# Patient Record
Sex: Male | Born: 1995
Health system: Southern US, Community
[De-identification: ages and names within clinical notes are randomized; demographics above are authoritative.]

---
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
## 2015-02-17 ENCOUNTER — Emergency Department: Payer: No Typology Code available for payment source

## 2015-02-17 ENCOUNTER — Encounter: Payer: Self-pay | Admitting: Emergency Medicine

## 2015-02-17 ENCOUNTER — Emergency Department
Admission: EM | Admit: 2015-02-17 | Discharge: 2015-02-17 | Disposition: A | Discharge status: Home / Self Care | Payer: No Typology Code available for payment source | Attending: Emergency Medicine | Admitting: Emergency Medicine

## 2015-02-17 DIAGNOSIS — Z23 Encounter for immunization: Secondary | ICD-10-CM | POA: Diagnosis not present

## 2015-02-17 DIAGNOSIS — Y9389 Activity, other specified: Secondary | ICD-10-CM | POA: Insufficient documentation

## 2015-02-17 DIAGNOSIS — S0990XA Unspecified injury of head, initial encounter: Secondary | ICD-10-CM | POA: Diagnosis present

## 2015-02-17 DIAGNOSIS — S0001XA Abrasion of scalp, initial encounter: Secondary | ICD-10-CM | POA: Insufficient documentation

## 2015-02-17 DIAGNOSIS — Y998 Other external cause status: Secondary | ICD-10-CM | POA: Diagnosis not present

## 2015-02-17 DIAGNOSIS — Y9289 Other specified places as the place of occurrence of the external cause: Secondary | ICD-10-CM | POA: Insufficient documentation

## 2015-02-17 DIAGNOSIS — S0219XA Other fracture of base of skull, initial encounter for closed fracture: Secondary | ICD-10-CM

## 2015-02-17 DIAGNOSIS — S0011XA Contusion of right eyelid and periocular area, initial encounter: Secondary | ICD-10-CM | POA: Diagnosis not present

## 2015-02-17 DIAGNOSIS — S060X1A Concussion with loss of consciousness of 30 minutes or less, initial encounter: Secondary | ICD-10-CM | POA: Diagnosis not present

## 2015-02-17 MED ORDER — TETANUS-DIPHTH-ACELL PERTUSSIS 5-2.5-18.5 LF-MCG/0.5 IM SUSP
0.5000 mL | Freq: Once | INTRAMUSCULAR | Status: AC
  Administered 2015-02-17: 0.5 mL via INTRAMUSCULAR
  Filled 2015-02-17: qty 0.5

## 2015-02-17 MED ORDER — IBUPROFEN 800 MG PO TABS: 800.0000 mg | ORAL_TABLET | Freq: Three times a day (TID) | ORAL | Status: DC | PRN
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
## 2015-02-17 NOTE — Discharge Instructions (Signed)
Concussion, Adult A concussion, or closed-head injury, is a brain injury caused by a direct blow to the head or by a quick and sudden movement (jolt) of the head or neck. Concussions are usually not life-threatening. Even so, the effects of a concussion can be serious. If you have had a concussion before, you are more likely to experience concussion-like symptoms after a direct blow to the head.  CAUSES  Direct blow to the head, such as from running into another player during a soccer game, being hit in a fight, or hitting your head on a hard surface.  A jolt of the head or neck that causes the brain to move back and forth inside the skull, such as in a car crash. SIGNS AND SYMPTOMS The signs of a concussion can be hard to notice. Early on, they may be missed by you, family members, and health care providers. You may look fine but act or feel differently. Symptoms are usually temporary, but they may last for days, weeks, or even longer. Some symptoms may appear right away while others may not show up for hours or days. Every head injury is different. Symptoms include:  Mild to moderate headaches that will not go away.  A feeling of pressure inside your head.  Having more trouble than usual:  Learning or remembering things you have heard.  Answering questions.  Paying attention or concentrating.  Organizing daily tasks.  Making decisions and solving problems.  Slowness in thinking, acting or reacting, speaking, or reading.  Getting lost or being easily confused.  Feeling tired all the time or lacking energy (fatigued).  Feeling drowsy.  Sleep disturbances.  Sleeping more than usual.  Sleeping less than usual.  Trouble falling asleep.  Trouble sleeping (insomnia).  Loss of balance or feeling lightheaded or dizzy.  Nausea or vomiting.  Numbness or tingling.  Increased sensitivity to:  Sounds.  Lights.  Distractions.  Vision problems or eyes that tire  easily.  Diminished sense of taste or smell.  Ringing in the ears.  Mood changes such as feeling sad or anxious.  Becoming easily irritated or angry for little or no reason.  Lack of motivation.  Seeing or hearing things other people do not see or hear (hallucinations). DIAGNOSIS Your health care provider can usually diagnose a concussion based on a description of your injury and symptoms. He or she will ask whether you passed out (lost consciousness) and whether you are having trouble remembering events that happened right before and during your injury. Your evaluation might include:  A brain scan to look for signs of injury to the brain. Even if the test shows no injury, you may still have a concussion.  Blood tests to be sure other problems are not present. TREATMENT  Concussions are usually treated in an emergency department, in urgent care, or at a clinic. You may need to stay in the hospital overnight for further treatment.  Tell your health care provider if you are taking any medicines, including prescription medicines, over-the-counter medicines, and natural remedies. Some medicines, such as blood thinners (anticoagulants) and aspirin, may increase the chance of complications. Also tell your health care provider whether you have had alcohol or are taking illegal drugs. This information may affect treatment.  Your health care provider will send you home with important instructions to follow.  How fast you will recover from a concussion depends on many factors. These factors include how severe your concussion is, what part of your brain was injured,  your age, and how healthy you were before the concussion.  Most people with mild injuries recover fully. Recovery can take time. In general, recovery is slower in older persons. Also, persons who have had a concussion in the past or have other medical problems may find that it takes longer to recover from their current injury. HOME  CARE INSTRUCTIONS General Instructions  Carefully follow the directions your health care provider gave you.  Only take over-the-counter or prescription medicines for pain, discomfort, or fever as directed by your health care provider.  Take only those medicines that your health care provider has approved.  Do not drink alcohol until your health care provider says you are well enough to do so. Alcohol and certain other drugs may slow your recovery and can put you at risk of further injury.  If it is harder than usual to remember things, write them down.  If you are easily distracted, try to do one thing at a time. For example, do not try to watch TV while fixing dinner.  Talk with family members or close friends when making important decisions.  Keep all follow-up appointments. Repeated evaluation of your symptoms is recommended for your recovery.  Watch your symptoms and tell others to do the same. Complications sometimes occur after a concussion. Older adults with a brain injury may have a higher risk of serious complications, such as a blood clot on the brain.  Tell your teachers, school nurse, school counselor, coach, athletic trainer, or work Freight forwarder about your injury, symptoms, and restrictions. Tell them about what you can or cannot do. They should watch for:  Increased problems with attention or concentration.  Increased difficulty remembering or learning new information.  Increased time needed to complete tasks or assignments.  Increased irritability or decreased ability to cope with stress.  Increased symptoms.  Rest. Rest helps the brain to heal. Make sure you:  Get plenty of sleep at night. Avoid staying up late at night.  Keep the same bedtime hours on weekends and weekdays.  Rest during the day. Take daytime naps or rest breaks when you feel tired.  Limit activities that require a lot of thought or concentration. These include:  Doing homework or job-related  work.  Watching TV.  Working on the computer.  Avoid any situation where there is potential for another head injury (football, hockey, soccer, basketball, martial arts, downhill snow sports and horseback riding). Your condition will get worse every time you experience a concussion. You should avoid these activities until you are evaluated by the appropriate follow-up health care providers. Returning To Your Regular Activities You will need to return to your normal activities slowly, not all at once. You must give your body and brain enough time for recovery.  Do not return to sports or other athletic activities until your health care provider tells you it is safe to do so.  Ask your health care provider when you can drive, ride a bicycle, or operate heavy machinery. Your ability to react may be slower after a brain injury. Never do these activities if you are dizzy.  Ask your health care provider about when you can return to work or school. Preventing Another Concussion It is very important to avoid another brain injury, especially before you have recovered. In rare cases, another injury can lead to permanent brain damage, brain swelling, or death. The risk of this is greatest during the first 7-10 days after a head injury. Avoid injuries by:  Wearing a  seat belt when riding in a car.  Drinking alcohol only in moderation.  Wearing a helmet when biking, skiing, skateboarding, skating, or doing similar activities.  Avoiding activities that could lead to a second concussion, such as contact or recreational sports, until your health care provider says it is okay.  Taking safety measures in your home.  Remove clutter and tripping hazards from floors and stairways.  Use grab bars in bathrooms and handrails by stairs.  Place non-slip mats on floors and in bathtubs.  Improve lighting in dim areas. SEEK MEDICAL CARE IF:  You have increased problems paying attention or  concentrating.  You have increased difficulty remembering or learning new information.  You need more time to complete tasks or assignments than before.  You have increased irritability or decreased ability to cope with stress.  You have more symptoms than before. Seek medical care if you have any of the following symptoms for more than 2 weeks after your injury:  Lasting (chronic) headaches.  Dizziness or balance problems.  Nausea.  Vision problems.  Increased sensitivity to noise or light.  Depression or mood swings.  Anxiety or irritability.  Memory problems.  Difficulty concentrating or paying attention.  Sleep problems.  Feeling tired all the time. SEEK IMMEDIATE MEDICAL CARE IF:  You have severe or worsening headaches. These may be a sign of a blood clot in the brain.  You have weakness (even if only in one hand, leg, or part of the face).  You have numbness.  You have decreased coordination.  You vomit repeatedly.  You have increased sleepiness.  One pupil is larger than the other.  You have convulsions.  You have slurred speech.  You have increased confusion. This may be a sign of a blood clot in the brain.  You have increased restlessness, agitation, or irritability.  You are unable to recognize people or places.  You have neck pain.  It is difficult to wake you up.  You have unusual behavior changes.  You lose consciousness. MAKE SURE YOU:  Understand these instructions.  Will watch your condition.  Will get help right away if you are not doing well or get worse.   This information is not intended to replace advice given to you by your health care provider. Make sure you discuss any questions you have with your health care provider.   Document Released: 06/15/2003 Document Revised: 04/15/2014 Document Reviewed: 10/15/2012 Elsevier Interactive Patient Education 2016 Else[employer]  Skull Fracture, Uncomplicated, Adult You have a  skull fracture. This happens when one of the bones of your head cracks or breaks. Your injury does not appear serious at this time and we feel you can be observed safely at home. An injury to the head that causes a skull fracture may also cause a concussion. With a concussion you may be knocked out for a brief moment (loss of consciousness). A concussion is the mildest form of traumatic brain injury. The symptoms of a concussion are short-lived and resolve on their own. The most common symptoms are confusion and forgetting what happened (amnesia). SYMPTOMS These minor symptoms may be experienced after discharge:  Memory difficulties.  Dizziness.  Headaches.  Hearing difficulties.  Vertigo or trouble with balance.  Depression.  Tiredness.  Difficulty with concentration.  Nausea.  Vomiting. A bruise on the brain (concussion) requires a few days for recovery the same as a bruise elsewhere on your body. It is common for people with injuries such as yours to experience such  problems. Usually these problems disappear without medical care. If symptoms remain for more than a few days, notify your caregiver. See your caregiver sooner if symptoms are becoming worse rather than better. HOME CARE INSTRUCTIONS   During the next 24 hours you should stay with an adult who can watch you for the above warning signs.  This person should wake you up every 02-03 hours to check on your condition, noting any of the above signs or symptoms. Problems which are getting worse mean you should call or return immediately to the facility where you were just seen, or to the nearest emergency department. In case of emergency or unconsciousness, call for local emergency medical help.  You should take clear liquids for the rest of the day and then resume your regular diet.  You should not take sedatives or alcoholic beverages for 48 hours after discharge.  After injuries such as yours, most serious problems happen  within the first 24 hours.  If x-rays or other testing were done, make sure you know how you are to get those results. It is your responsibility to call back for results when your caregiver suggests. Do not assume everything is fine if your caregiver has not been able to reach you.  Skull fractures usually do not need follow up x-rays. These fractures are not like broken arms. The position of the skull stays the same as when it was broken and usually heals without problems.  If you have a concussion be aware that symptoms may last up to a week or longer.  It is very important to keep any follow-up appointments after a head injury.  It is unlikely that serious side effects will occur. If they do occur it is usually soon after the accident but may occur as long as a week after the accident or injury. SEEK IMMEDIATE MEDICAL CARE IF:   Confusion or drowsiness develops; children frequently become drowsy after any type of trauma or injury.  A person cannot arouse the injured person.  You feel sick to your stomach (nausea) or persistent, forceful vomiting (projectile in nature).  Rapid back and forth movement of the eyes. This is called nystagmus.  Convulsions, seizures, or unconsciousness.  Severe persistent headaches not relieved by medication. Only take over-the-counter or prescription medicines for pain, discomfort, or fever as directed by your caregiver. (Do not take aspirin as this weakens blood clotting abilities).  Inability to use arms or legs appropriately.  Changes in pupil sizes.  Clear or bloody discharge from nose or ears.   This information is not intended to replace advice given to you by your health care provider. Make sure you discuss any questions you have with your health care provider.   Document Released: 01/24/2004 Document Revised: 06/17/2011 Document Reviewed: 09/16/2014 Elsevier Interactive Patient Education [employer] Elsevier Inc[employer]
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
## 2016-03-25 ENCOUNTER — Other Ambulatory Visit: Payer: Self-pay | Admitting: Family Medicine

## 2016-03-25 DIAGNOSIS — R319 Hematuria, unspecified: Secondary | ICD-10-CM

## 2016-03-25 DIAGNOSIS — R109 Unspecified abdominal pain: Secondary | ICD-10-CM

## 2016-03-26 ENCOUNTER — Ambulatory Visit
Admission: RE | Admit: 2016-03-26 | Discharge: 2016-03-26 | Disposition: A | Discharge status: Home / Self Care | Source: Ambulatory Visit | Attending: Family Medicine | Admitting: Family Medicine

## 2016-03-26 DIAGNOSIS — R109 Unspecified abdominal pain: Secondary | ICD-10-CM | POA: Diagnosis not present

## 2016-03-26 DIAGNOSIS — R319 Hematuria, unspecified: Secondary | ICD-10-CM | POA: Insufficient documentation

## 2017-10-29 DIAGNOSIS — G43109 Migraine with aura, not intractable, without status migrainosus: Secondary | ICD-10-CM | POA: Diagnosis not present

## 2018-02-05 DIAGNOSIS — J029 Acute pharyngitis, unspecified: Secondary | ICD-10-CM | POA: Diagnosis not present

## 2018-07-06 DIAGNOSIS — J069 Acute upper respiratory infection, unspecified: Secondary | ICD-10-CM | POA: Diagnosis not present

## 2019-01-12 ENCOUNTER — Encounter: Payer: Self-pay | Admitting: Intensive Care

## 2019-01-12 ENCOUNTER — Other Ambulatory Visit: Payer: Self-pay

## 2019-01-12 ENCOUNTER — Emergency Department: Payer: 59

## 2019-01-12 ENCOUNTER — Emergency Department
Admission: EM | Admit: 2019-01-12 | Discharge: 2019-01-12 | Disposition: A | Discharge status: Home / Self Care | Payer: 59 | Attending: Emergency Medicine | Admitting: Emergency Medicine

## 2019-01-12 DIAGNOSIS — Y999 Unspecified external cause status: Secondary | ICD-10-CM | POA: Diagnosis not present

## 2019-01-12 DIAGNOSIS — Y9389 Activity, other specified: Secondary | ICD-10-CM | POA: Diagnosis not present

## 2019-01-12 DIAGNOSIS — S39011A Strain of muscle, fascia and tendon of abdomen, initial encounter: Secondary | ICD-10-CM | POA: Diagnosis not present

## 2019-01-12 DIAGNOSIS — R1031 Right lower quadrant pain: Secondary | ICD-10-CM

## 2019-01-12 DIAGNOSIS — Y929 Unspecified place or not applicable: Secondary | ICD-10-CM | POA: Insufficient documentation

## 2019-01-12 DIAGNOSIS — F17290 Nicotine dependence, other tobacco product, uncomplicated: Secondary | ICD-10-CM | POA: Insufficient documentation

## 2019-01-12 DIAGNOSIS — X500XXA Overexertion from strenuous movement or load, initial encounter: Secondary | ICD-10-CM | POA: Insufficient documentation

## 2019-01-12 DIAGNOSIS — K4090 Unilateral inguinal hernia, without obstruction or gangrene, not specified as recurrent: Secondary | ICD-10-CM

## 2019-01-12 DIAGNOSIS — R1032 Left lower quadrant pain: Secondary | ICD-10-CM

## 2019-01-12 DIAGNOSIS — S3991XA Unspecified injury of abdomen, initial encounter: Secondary | ICD-10-CM | POA: Diagnosis present

## 2019-01-12 LAB — URINALYSIS, COMPLETE (UACMP) WITH MICROSCOPIC
Bacteria, UA: NONE SEEN
Bilirubin Urine: NEGATIVE
Glucose, UA: NEGATIVE mg/dL
Hgb urine dipstick: NEGATIVE
Ketones, ur: NEGATIVE mg/dL
Leukocytes,Ua: NEGATIVE
Nitrite: NEGATIVE
Protein, ur: NEGATIVE mg/dL
Specific Gravity, Urine: 1.021 (ref 1.005–1.030)
Squamous Epithelial / LPF: NONE SEEN (ref 0–5)
pH: 5 (ref 5.0–8.0)

## 2019-01-12 MED ORDER — TRAMADOL HCL 50 MG PO TABS
50.0000 mg | ORAL_TABLET | Freq: Once | ORAL | Status: AC
  Administered 2019-01-12: 50 mg via ORAL
  Filled 2019-01-12: qty 1

## 2019-01-12 MED ORDER — IBUPROFEN 600 MG PO TABS
600.0000 mg | ORAL_TABLET | Freq: Once | ORAL | Status: AC
  Administered 2019-01-12: 600 mg via ORAL
  Filled 2019-01-12: qty 1

## 2019-01-12 MED ORDER — IBUPROFEN 600 MG PO TABS: 600.0000 mg | ORAL_TABLET | Freq: Three times a day (TID) | ORAL | 0 refills | Status: AC | PRN

## 2019-01-12 MED ORDER — TRAMADOL HCL 50 MG PO TABS: 50.0000 mg | ORAL_TABLET | Freq: Four times a day (QID) | ORAL | 0 refills | Status: AC | PRN

## 2019-01-12 NOTE — ED Triage Notes (Signed)
Patient c/o bilateral groin pain. Also reports a bump is now present on penis. Reports sensitive to touch around private area

## 2019-01-12 NOTE — ED Notes (Signed)
Patient provided urine specimen cup, unable to provide specimen at this time.

## 2019-01-12 NOTE — ED Provider Notes (Signed)
[facility] Emergency Department Provider Note   ____________________________________________   First MD Initiated Contact with Patient 01/12/19 1204     (approximate)  I have reviewed the triage vital signs and the nursing notes.   HISTORY  Chief Complaint Groin Pain    HPI Ryan Poole is a 23 y.o. male patient complain of bilateral groin pain 2 days.  Patient the pain increased with squatting.  Patient state sensitive around superior aspect of the scrotum bilaterally.  Patient denies dysuria or urethral discharge.  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  Patient rates pain as a 5/10.  Patient described pain as "achy".  No palliative measure for complaint.         History reviewed. No pertinent past medical history.  There are no active problems to display for this patient.   History reviewed. No pertinent surgical history.  Prior to Admission medications   Medication Sig Start Date End Date Taking? Authorizing Provider  ibuprofen (ADVIL) 600 MG tablet Take 1 tablet (600 mg total) by mouth every 8 (eight) hours as needed. 01/12/19   Sable Feil, PA-C  traMADol (ULTRAM) 50 MG tablet Take 1 tablet (50 mg total) by mouth every 6 (six) hours as needed for up to 3 days. 01/12/19 01/15/19  Sable Feil, PA-C    Allergies Patient has no known allergies.  History reviewed. No pertinent family history.  Social History Social History   Tobacco Use  . Smoking status: Current Every Day Smoker    Types: E-cigarettes  . Smokeless tobacco: Never Used  Substance Use Topics  . Alcohol use: Yes    Alcohol/week: 8.0 standard drinks    Types: 8 Cans of beer per week  . Drug use: No    Review of Systems Constitutional: No fever/chills Eyes: No visual changes. ENT: No sore throat. Cardiovascular: Denies chest pain. Respiratory: Denies shortness of breath. Gastrointestinal: No abdominal  pain.  No nausea, no vomiting.  No diarrhea.  No constipation. Genitourinary: Negative for dysuria. Musculoskeletal: Bilateral ankle pain.   Skin: Negative for rash. Neurological: Negative for headaches, focal weakness or numbness.   ____________________________________________   PHYSICAL EXAM:  VITAL SIGNS: ED Triage Vitals  Enc Vitals Group     BP 01/12/19 1143 121/72     Pulse Rate 01/12/19 1143 80     Resp 01/12/19 1143 14     Temp 01/12/19 1143 98.7 F (37.1 C)     Temp Source 01/12/19 1143 Oral     SpO2 01/12/19 1143 97 %     Weight 01/12/19 1144 160 lb (72.6 kg)     Height 01/12/19 1144 5\' 11"  (1.803 m)     Head Circumference --      Peak Flow --      Pain Score 01/12/19 1144 5     Pain Loc --      Pain Edu? --      Excl. in [place]? --    Constitutional: Alert and oriented. Well appearing and in no acute distress. Cardiovascular: Normal rate, regular rhythm. Grossly normal heart sounds.  Good peripheral circulation. Respiratory: Normal respiratory effort.  No retractions. Lungs CTAB. Gastrointestinal: Soft and nontender. No distention. No abdominal bruits. No CVA tenderness. Genitourinary: No obvious mass or lesions. Musculoskeletal: No lower extremity tenderness nor edema.  No joint effusions. Neurologic:  Normal speech and language. No gross focal neurologic deficits are appreciated. No gait instability. Skin:  Skin is  warm, dry and intact. No rash noted. Psychiatric: Mood and affect are normal. Speech and behavior are normal.  ____________________________________________   LABS (all labs ordered are listed, but only abnormal results are displayed)  Labs Reviewed  URINALYSIS, COMPLETE (UACMP) WITH MICROSCOPIC - Abnormal; Notable for the following components:      Result Value   Color, Urine YELLOW (*)    APPearance CLEAR (*)    All other components within normal limits   ____________________________________________  EKG    ____________________________________________  RADIOLOGY  ED MD interpretation:    Official radiology report(s): [place] Lt Lower Extrem Ltd Soft Tissue Non Vascular  Result Date: 01/12/2019 CLINICAL DATA:  LEFT inguinal pain.  Pain after heavy lifting. EXAM: ULTRASOUND LEFT LOWER EXTREMITY LIMITED TECHNIQUE: Ultrasound examination of the lower extremity soft tissues was performed in the area of clinical concern. COMPARISON:  None. FINDINGS: Joint Space: No effusion. Muscles: Normal. Tendons: Normal Other Soft Tissue Structures: Small benign-appearing lymph nodes adjacent to inguinal canal. No evidence hernia. LEFT and RIGHT inguinal region at the evaluated. IMPRESSION: No inguinal hernia identified. Bilateral benign-appearing inguinal lymph nodes. Electronically Signed   By: Genevive Bi M.D.   On: 01/12/2019 13:20    ____________________________________________   PROCEDURES  Procedure(s) performed (including Critical Care):  Procedures   ____________________________________________   INITIAL IMPRESSION / ASSESSMENT AND PLAN / ED COURSE  As part of my medical decision making, I reviewed the following data within the electronic MEDICAL RECORD NUMBER         JEP DYAS was evaluated in Emergency Department on 01/12/2019 for the symptoms described in the history of present illness. He was evaluated in the context of the global COVID-19 pandemic, which necessitated consideration that the patient might be at risk for infection with the SARS-CoV-2 virus that causes COVID-19. Institutional protocols and algorithms that pertain to the evaluation of patients at risk for COVID-19 are in a state of rapid change based on information released by regulatory bodies including the CDC and federal and state organizations. These policies and algorithms were followed during the patient's care in the ED.  Patient presents with bilateral inguinal pain secondary to repetitive heavy lifting and climbing  telephone poles.  Discussed negative ultrasound findings with patient.  Patient physical exam consistent with ankle strain.  Patient given discharge care instruction advised take medication as directed.  Patient advised follow-up if no improvement in 3 to 5 days.      ____________________________________________   FINAL CLINICAL IMPRESSION(S) / ED DIAGNOSES  Final diagnoses:  Ilio-inguinal strain, initial encounter     ED Discharge Orders         Ordered    traMADol (ULTRAM) 50 MG tablet  Every 6 hours PRN     01/12/19 1415    ibuprofen (ADVIL) 600 MG tablet  Every 8 hours PRN     01/12/19 1415           Note:  This document was prepared using Dragon voice recognition software and may include unintentional dictation errors.    Joni Reining, PA-C 01/12/19 1419    Chesley Noon, MD 01/12/19 [phone]

## 2019-01-12 NOTE — ED Notes (Signed)
Patient transported to Ultrasound 

## 2019-01-12 NOTE — ED Notes (Signed)
See triage note  Presents with bilateral groin pain   States he noticed pain on Saturday unsure of injury  Denies any urinary sxs'

## 2020-12-21 IMAGING — US US EXTREM LOW*R* LIMITED
1 series · 14 of 20 positions shown · non-contrast
Comparison: None.

CLINICAL DATA: LEFT inguinal pain.  Pain after heavy lifting.

EXAM:
ULTRASOUND LEFT LOWER EXTREMITY LIMITED
TECHNIQUE: Ultrasound examination of the lower extremity soft tissues was
performed in the area of clinical concern.

[Series 1: us extrem low*right* limited · 14 of 20 slices shown]
[im 1/20]
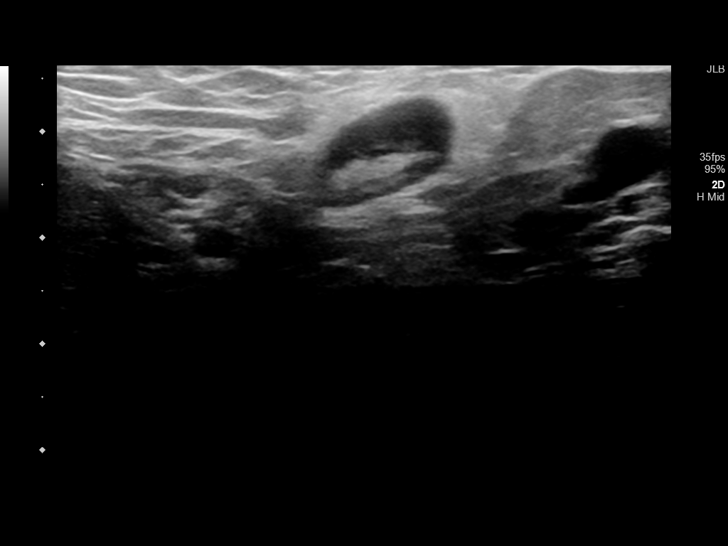
[im 3/20]
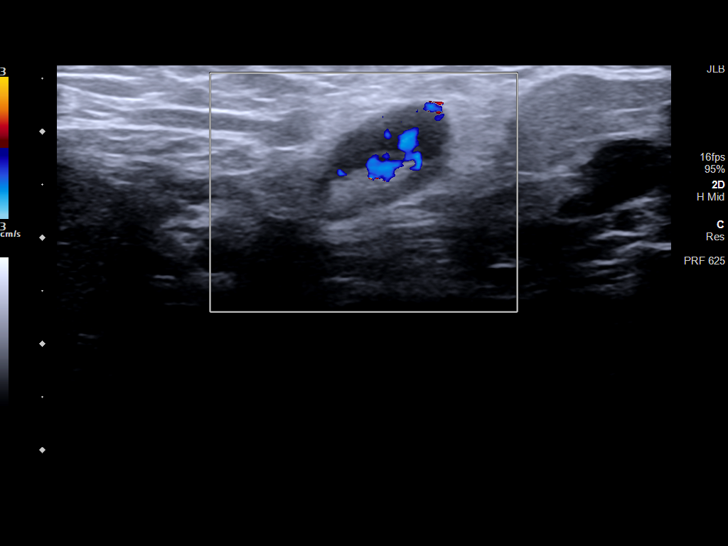
[im 4/20]
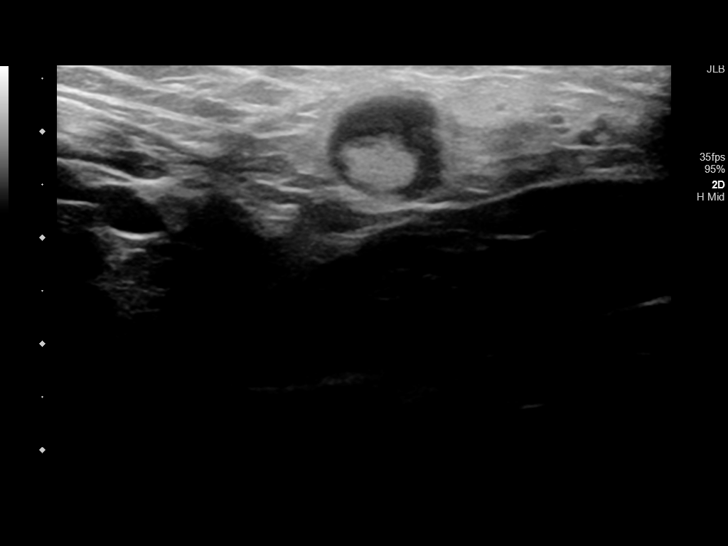
[im 6/20]
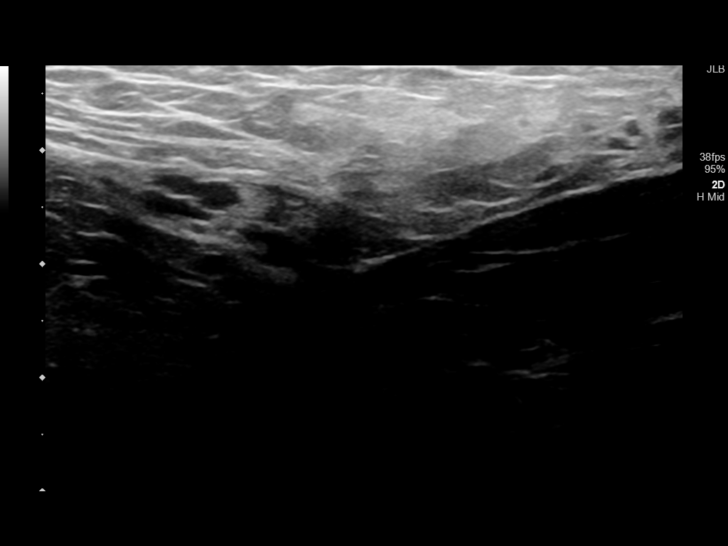
[im 7/20]
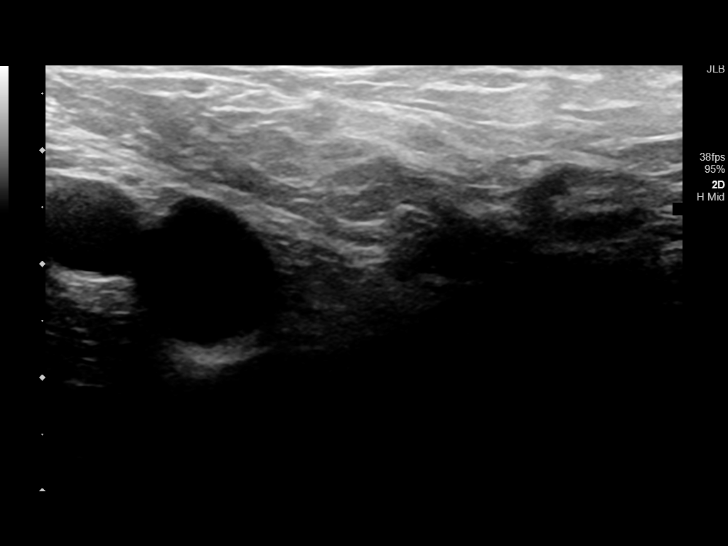
[im 8/20]
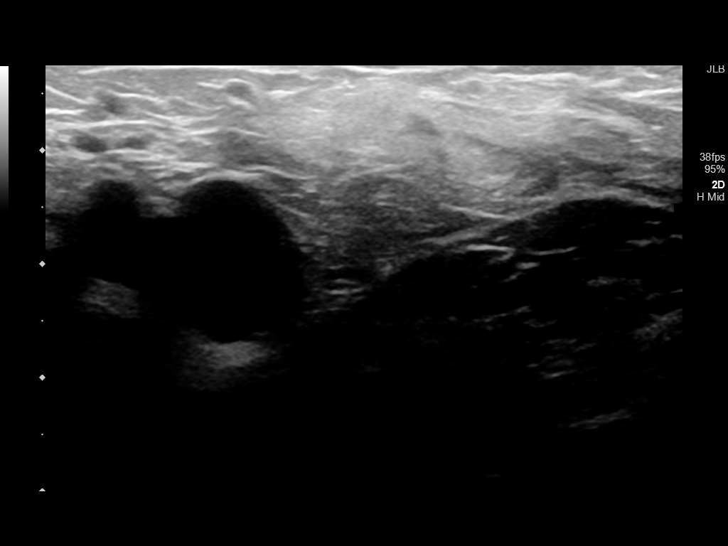
[im 10/20]
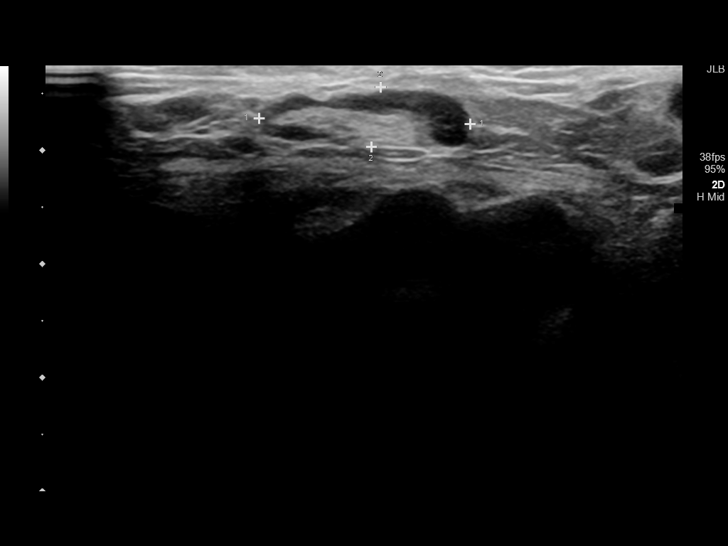
[im 11/20]
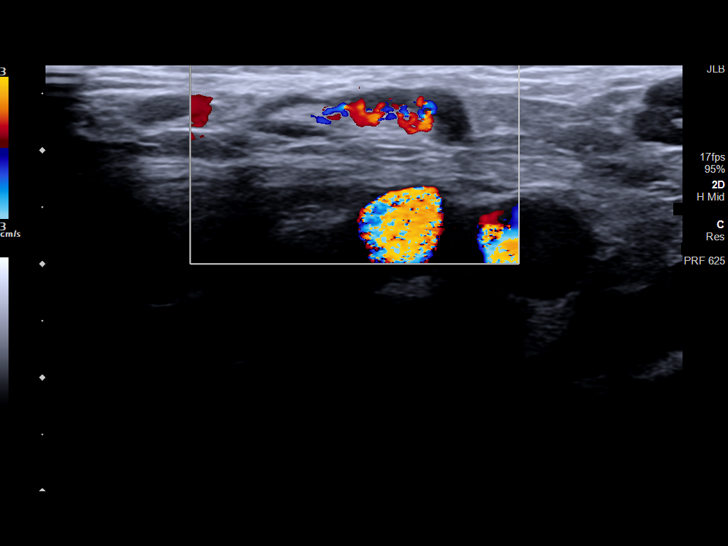
[im 13/20]
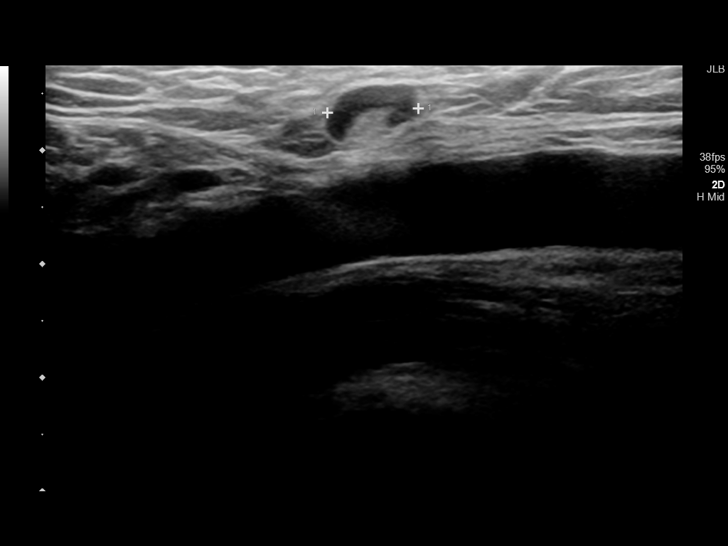
[im 14/20]
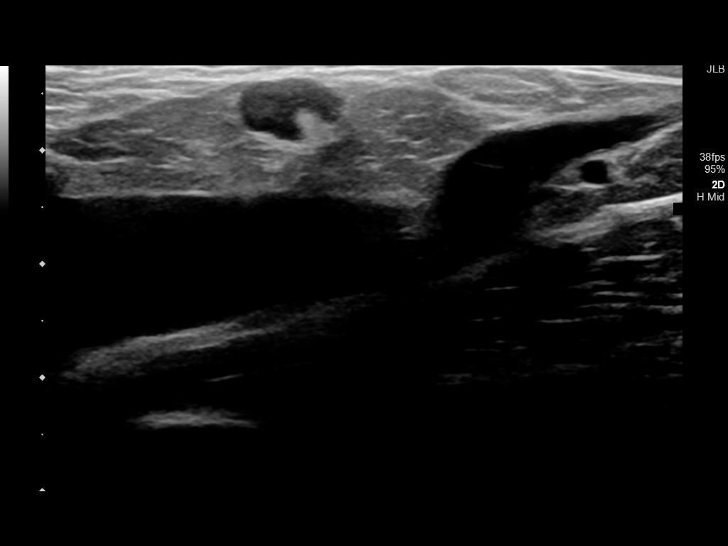
[im 16/20]
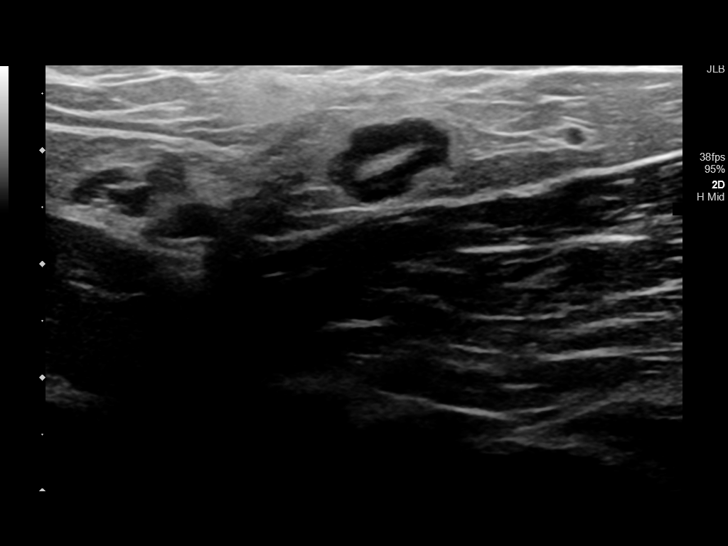
[im 17/20]
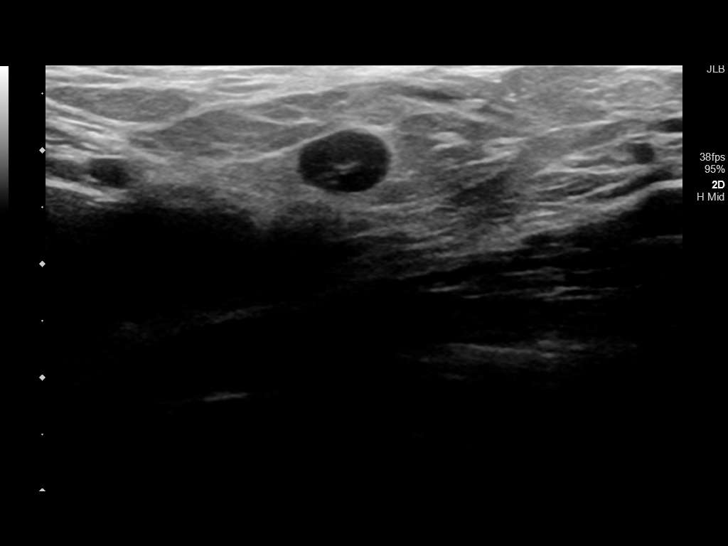
[im 18/20]
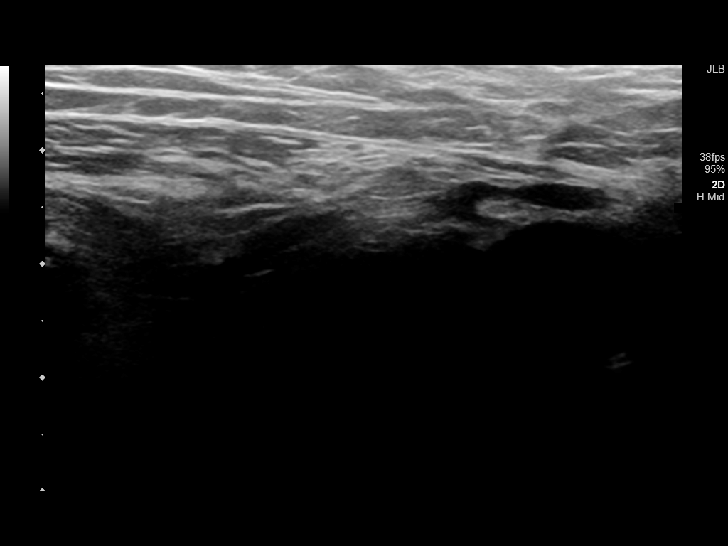
[im 20/20]
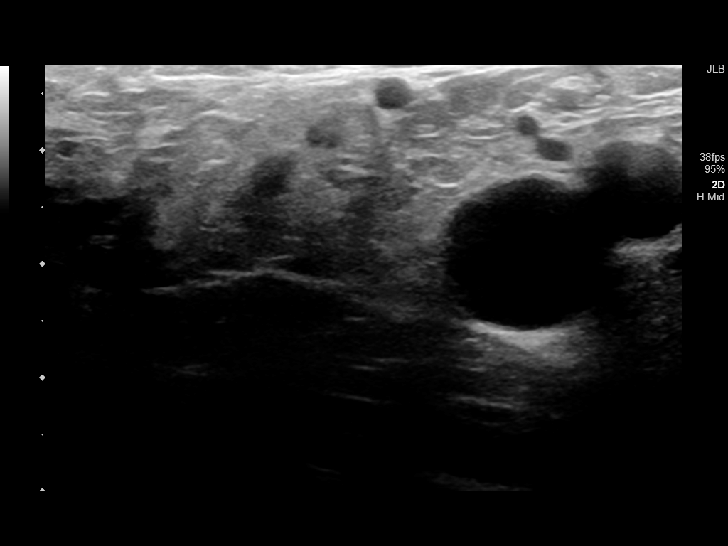

[14 of 20 positions shown; findings below may reference images not displayed]

FINDINGS: Joint Space: No effusion.

Muscles: Normal.

Tendons: Normal

Other Soft Tissue Structures: Small benign-appearing lymph nodes
adjacent to inguinal canal. No evidence hernia. LEFT and RIGHT
inguinal region at the evaluated.
IMPRESSION: No inguinal hernia identified. Bilateral benign-appearing inguinal
lymph nodes.

## 2022-03-13 ENCOUNTER — Emergency Department: Payer: 59

## 2022-03-13 ENCOUNTER — Other Ambulatory Visit: Payer: Self-pay

## 2022-03-13 ENCOUNTER — Emergency Department
Admission: EM | Admit: 2022-03-13 | Discharge: 2022-03-13 | Disposition: A | Discharge status: Home / Self Care | Payer: 59 | Attending: Emergency Medicine | Admitting: Emergency Medicine

## 2022-03-13 DIAGNOSIS — G43819 Other migraine, intractable, without status migrainosus: Secondary | ICD-10-CM | POA: Insufficient documentation

## 2022-03-13 DIAGNOSIS — R519 Headache, unspecified: Secondary | ICD-10-CM | POA: Diagnosis present

## 2022-03-13 LAB — CBC WITH DIFFERENTIAL/PLATELET
Abs Immature Granulocytes: 0.01 10*3/uL (ref 0.00–0.07)
Basophils Absolute: 0 10*3/uL (ref 0.0–0.1)
Basophils Relative: 1 %
Eosinophils Absolute: 0 10*3/uL (ref 0.0–0.5)
Eosinophils Relative: 1 %
HCT: 44.2 % (ref 39.0–52.0)
Hemoglobin: 14.8 g/dL (ref 13.0–17.0)
Immature Granulocytes: 0 %
Lymphocytes Relative: 28 %
Lymphs Abs: 2.1 10*3/uL (ref 0.7–4.0)
MCH: 30 pg (ref 26.0–34.0)
MCHC: 33.5 g/dL (ref 30.0–36.0)
MCV: 89.7 fL (ref 80.0–100.0)
Monocytes Absolute: 0.5 10*3/uL (ref 0.1–1.0)
Monocytes Relative: 6 %
Neutro Abs: 4.8 10*3/uL (ref 1.7–7.7)
Neutrophils Relative %: 64 %
Platelets: 245 10*3/uL (ref 150–400)
RBC: 4.93 MIL/uL (ref 4.22–5.81)
RDW: 12.7 % (ref 11.5–15.5)
WBC: 7.4 10*3/uL (ref 4.0–10.5)
nRBC: 0 % (ref 0.0–0.2)

## 2022-03-13 LAB — BASIC METABOLIC PANEL
Anion gap: 10 (ref 5–15)
BUN: 15 mg/dL (ref 6–20)
CO2: 24 mmol/L (ref 22–32)
Calcium: 9.2 mg/dL (ref 8.9–10.3)
Chloride: 106 mmol/L (ref 98–111)
Creatinine, Ser: 0.75 mg/dL (ref 0.61–1.24)
GFR, Estimated: >60 mL/min (ref >60)
Glucose, Bld: 87 mg/dL (ref 70–99)
Potassium: 3.6 mmol/L (ref 3.5–5.1)
Sodium: 140 mmol/L (ref 135–145)

## 2022-03-13 MED ORDER — PROCHLORPERAZINE EDISYLATE 10 MG/2ML IJ SOLN
10.0000 mg | Freq: Once | INTRAMUSCULAR | Status: AC
  Administered 2022-03-13: 10 mg via INTRAVENOUS
  Filled 2022-03-13: qty 2

## 2022-03-13 MED ORDER — DEXAMETHASONE SODIUM PHOSPHATE 10 MG/ML IJ SOLN
10.0000 mg | Freq: Once | INTRAMUSCULAR | Status: AC
  Administered 2022-03-13: 10 mg via INTRAVENOUS
  Filled 2022-03-13: qty 1

## 2022-03-13 MED ORDER — DIPHENHYDRAMINE HCL 50 MG/ML IJ SOLN
12.5000 mg | Freq: Once | INTRAMUSCULAR | Status: AC
  Administered 2022-03-13: 12.5 mg via INTRAVENOUS
  Filled 2022-03-13: qty 1

## 2022-03-13 MED ORDER — ACETAMINOPHEN 500 MG PO TABS
1000.0000 mg | ORAL_TABLET | Freq: Once | ORAL | Status: AC
  Administered 2022-03-13: 1000 mg via ORAL
  Filled 2022-03-13: qty 2

## 2022-03-13 MED ORDER — SODIUM CHLORIDE 0.9 % IV BOLUS
1000.0000 mL | Freq: Once | INTRAVENOUS | Status: AC
  Administered 2022-03-13: 1000 mL via INTRAVENOUS

## 2022-03-13 NOTE — ED Provider Notes (Signed)
Ryan Poole Provider Note    Event Date/Time   First MD Initiated Contact with Patient 03/13/22 1421     (approximate)   History   Migraine   HPI  Ryan Poole is a 26 y.o. male with remote history of migraines who comes in with concerns for headache.  Patient reports having a headache nearly daily for the past few days.  He reports that the headaches are a lot more severe than prior.  He has had some vomiting as well.  He reports waking up this morning feeling totally fine but then about 20 minutes prior to coming in he developed the headache again.  He reports that slightly decreased from earlier but he is not taking any of his Maxalt to help.  He reports sensitivity to light.  Denies any falls or hitting his head.  He is not sure if he is just dehydrated.  He actually stated that he has a neurology appointment on Friday.  Physical Exam   Triage Vital Signs: ED Triage Vitals  Enc Vitals Group     BP 03/13/22 1217 (!) 134/90     Pulse Rate 03/13/22 1217 75     Resp 03/13/22 1217 17     Temp 03/13/22 1217 98 F (36.7 C)     Temp Source 03/13/22 1217 Oral     SpO2 03/13/22 1217 96 %     Weight --      Height --      Head Circumference --      Peak Flow --      Pain Score 03/13/22 1212 10     Pain Loc --      Pain Edu? --      Excl. in [place]? --     Most recent vital signs: Vitals:   03/13/22 1217 03/13/22 1218  BP: (!) 134/90   Pulse: 75   Resp: 17   Temp: 98 F (36.7 C) 98 F (36.7 C)  SpO2: 96%      General: Awake, no distress.  CV:  Good peripheral perfusion.  Resp:  Normal effort.  Abd:  No distention.  Other:  Cranor nerves II to XII are intact.  Pupils equal round and reactive.  Extraocular movements are intact.   ED Results / Procedures / Treatments   Labs (all labs ordered are listed, but only abnormal results are displayed) Labs Reviewed  CBC WITH DIFFERENTIAL/PLATELET  BASIC METABOLIC PANEL      RADIOLOGY Pending  PROCEDURES:  Critical Care performed: No  Procedures   MEDICATIONS ORDERED IN ED: Medications  sodium chloride 0.9 % bolus 1,000 mL (1,000 mLs Intravenous New Bag/Given 03/13/22 1449)  diphenhydrAMINE (BENADRYL) injection 12.5 mg (12.5 mg Intravenous Given 03/13/22 1447)  prochlorperazine (COMPAZINE) injection 10 mg (10 mg Intravenous Given 03/13/22 1445)  acetaminophen (TYLENOL) tablet 1,000 mg (1,000 mg Oral Given 03/13/22 1449)     IMPRESSION / MDM / ASSESSMENT AND PLAN / ED COURSE  I reviewed the triage vital signs and the nursing notes.   Patient's presentation is most consistent with acute presentation with potential threat to life or bodily function.   She comes in with headaches.  Headache within 6 hours.  We discussed CT imaging he would like to proceed to rule out any Mass or intracranial hemorrhage.  Do not feel like it sounds like a subarachnoid and given the headache just started the CT head should be sensitive but again I suspect this is more likely migraine.  Labs ordered evaluate for any dehydration.  Patient given migraine cocktail.  Patient handoff to oncoming team pending these results        FINAL CLINICAL IMPRESSION(S) / ED DIAGNOSES   Final diagnoses:  Other migraine without status migrainosus, intractable     Rx / DC Orders   ED Discharge Orders     None        Note:  This document was prepared using Dragon voice recognition software and may include unintentional dictation errors.   Concha Se, MD 03/13/22 [phone]

## 2022-03-13 NOTE — ED Notes (Signed)
D/C and reasons to return to ED discussed. NAD noted. Pt ambulatory with steady gait.

## 2022-03-13 NOTE — Discharge Instructions (Signed)
Your CT head looks okay today

## 2022-03-13 NOTE — ED Triage Notes (Signed)
Pt comes with c/o migraine that started 30- minutes ago. Pt stats he has them on and off for past 4 days. Pt states 10/10 pain.
